# Patient Record
Sex: Male | Born: 1985 | Race: Black or African American | Hispanic: No | Marital: Single | Smoking: Never smoker
Health system: Southern US, Community
[De-identification: ages and names within clinical notes are randomized; demographics above are authoritative.]

## PROBLEM LIST (undated history)

## (undated) DIAGNOSIS — R569 Unspecified convulsions: Secondary | ICD-10-CM

## (undated) DIAGNOSIS — S3992XA Unspecified injury of lower back, initial encounter: Secondary | ICD-10-CM

---
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
## 2016-07-23 ENCOUNTER — Encounter (HOSPITAL_COMMUNITY): Payer: Self-pay | Admitting: Emergency Medicine

## 2016-07-23 ENCOUNTER — Emergency Department (HOSPITAL_COMMUNITY)
Admission: EM | Admit: 2016-07-23 | Discharge: 2016-07-23 | Disposition: A | Discharge status: Home / Self Care | Payer: Self-pay | Attending: Emergency Medicine | Admitting: Emergency Medicine

## 2016-07-23 ENCOUNTER — Emergency Department (HOSPITAL_COMMUNITY): Payer: Self-pay

## 2016-07-23 DIAGNOSIS — S0990XA Unspecified injury of head, initial encounter: Secondary | ICD-10-CM | POA: Insufficient documentation

## 2016-07-23 DIAGNOSIS — W228XXA Striking against or struck by other objects, initial encounter: Secondary | ICD-10-CM | POA: Insufficient documentation

## 2016-07-23 DIAGNOSIS — W19XXXA Unspecified fall, initial encounter: Secondary | ICD-10-CM

## 2016-07-23 DIAGNOSIS — Y939 Activity, unspecified: Secondary | ICD-10-CM | POA: Insufficient documentation

## 2016-07-23 DIAGNOSIS — Y929 Unspecified place or not applicable: Secondary | ICD-10-CM | POA: Insufficient documentation

## 2016-07-23 DIAGNOSIS — Y999 Unspecified external cause status: Secondary | ICD-10-CM | POA: Insufficient documentation

## 2016-07-23 LAB — CBG MONITORING, ED: Glucose-Capillary: 92 mg/dL (ref 65–99)

## 2016-07-23 MED ORDER — ACETAMINOPHEN 325 MG PO TABS
650.0000 mg | ORAL_TABLET | Freq: Once | ORAL | Status: AC
  Administered 2016-07-23: 650 mg via ORAL
  Filled 2016-07-23: qty 2

## 2016-07-23 MED ORDER — SODIUM CHLORIDE 0.9 % IV BOLUS (SEPSIS)
1000.0000 mL | Freq: Once | INTRAVENOUS | Status: AC
  Administered 2016-07-23: 1000 mL via INTRAVENOUS
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
## 2016-07-23 NOTE — ED Notes (Signed)
Pt returned from CT °
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
## 2016-07-23 NOTE — Discharge Instructions (Signed)
You have been seen today for evaluation following a fall and possible seizure. Your imaging showed no signs of bleeding or skull fracture. It is recommended that you follow-up with a neurologist as soon as possible. This will allow for further testing. It is recommended that you avoid activities that can be dangerous should you have a seizure. These include, but are not limited to, driving, operating heavy machinery, and drinking alcohol or using drugs. Having a seizure while performing these activities can make you a danger to yourself or others. Return to the ED should symptoms recur.  Due to your fall and hitting your head, you have likely sustained a concussion. Refer to the attached literature for details on symptoms that would require you to return to the ED. Take ibuprofen or naproxen for pain. Apply ice to areas of swelling to reduce inflammation.
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
## 2016-09-03 ENCOUNTER — Emergency Department (HOSPITAL_COMMUNITY): Payer: Self-pay

## 2016-09-03 ENCOUNTER — Encounter (HOSPITAL_COMMUNITY): Payer: Self-pay | Admitting: Emergency Medicine

## 2016-09-03 ENCOUNTER — Emergency Department (HOSPITAL_COMMUNITY)
Admission: EM | Admit: 2016-09-03 | Discharge: 2016-09-03 | Disposition: A | Discharge status: Home / Self Care | Payer: Self-pay | Attending: Emergency Medicine | Admitting: Emergency Medicine

## 2016-09-03 DIAGNOSIS — S8010XA Contusion of unspecified lower leg, initial encounter: Secondary | ICD-10-CM

## 2016-09-03 DIAGNOSIS — Y999 Unspecified external cause status: Secondary | ICD-10-CM | POA: Insufficient documentation

## 2016-09-03 DIAGNOSIS — S8002XA Contusion of left knee, initial encounter: Secondary | ICD-10-CM | POA: Insufficient documentation

## 2016-09-03 DIAGNOSIS — R51 Headache: Secondary | ICD-10-CM | POA: Insufficient documentation

## 2016-09-03 DIAGNOSIS — R569 Unspecified convulsions: Secondary | ICD-10-CM | POA: Insufficient documentation

## 2016-09-03 DIAGNOSIS — Y92410 Unspecified street and highway as the place of occurrence of the external cause: Secondary | ICD-10-CM | POA: Insufficient documentation

## 2016-09-03 DIAGNOSIS — S39012A Strain of muscle, fascia and tendon of lower back, initial encounter: Secondary | ICD-10-CM | POA: Insufficient documentation

## 2016-09-03 DIAGNOSIS — S8000XA Contusion of unspecified knee, initial encounter: Secondary | ICD-10-CM

## 2016-09-03 DIAGNOSIS — F17200 Nicotine dependence, unspecified, uncomplicated: Secondary | ICD-10-CM | POA: Insufficient documentation

## 2016-09-03 DIAGNOSIS — Y939 Activity, unspecified: Secondary | ICD-10-CM | POA: Insufficient documentation

## 2016-09-03 HISTORY — DX: Unspecified convulsions: R56.9

## 2016-09-03 LAB — CBC WITH DIFFERENTIAL/PLATELET
Basophils Absolute: 0 10*3/uL (ref 0.0–0.1)
Basophils Relative: 0 %
Eosinophils Absolute: 0.2 10*3/uL (ref 0.0–0.7)
Eosinophils Relative: 1 %
HCT: 38.5 % — ABNORMAL LOW (ref 39.0–52.0)
HEMOGLOBIN: 13.5 g/dL (ref 13.0–17.0)
LYMPHS ABS: 1.5 10*3/uL (ref 0.7–4.0)
LYMPHS PCT: 11 %
MCH: 31.6 pg (ref 26.0–34.0)
MCHC: 35.1 g/dL (ref 30.0–36.0)
MCV: 90.2 fL (ref 78.0–100.0)
Monocytes Absolute: 1.1 10*3/uL — ABNORMAL HIGH (ref 0.1–1.0)
Monocytes Relative: 8 %
NEUTROS PCT: 80 %
Neutro Abs: 11.2 10*3/uL — ABNORMAL HIGH (ref 1.7–7.7)
Platelets: 309 10*3/uL (ref 150–400)
RBC: 4.27 MIL/uL (ref 4.22–5.81)
RDW: 12.8 % (ref 11.5–15.5)
WBC: 14 10*3/uL — AB (ref 4.0–10.5)

## 2016-09-03 LAB — BASIC METABOLIC PANEL
ANION GAP: 9 (ref 5–15)
BUN: 8 mg/dL (ref 6–20)
CHLORIDE: 103 mmol/L (ref 101–111)
CO2: 24 mmol/L (ref 22–32)
Calcium: 9.3 mg/dL (ref 8.9–10.3)
Creatinine, Ser: 1.17 mg/dL (ref 0.61–1.24)
GFR calc Af Amer: >60 mL/min (ref >60)
GFR calc non Af Amer: >60 mL/min (ref >60)
GLUCOSE: 108 mg/dL — AB (ref 65–99)
POTASSIUM: 3.8 mmol/L (ref 3.5–5.1)
SODIUM: 136 mmol/L (ref 135–145)

## 2016-09-03 LAB — MAGNESIUM: Magnesium: 2.8 mg/dL — ABNORMAL HIGH (ref 1.7–2.4)

## 2016-09-03 LAB — PHOSPHORUS: Phosphorus: 1.6 mg/dL — ABNORMAL LOW (ref 2.5–4.6)

## 2016-09-03 MED ORDER — LEVETIRACETAM 500 MG PO TABS: 500.0000 mg | ORAL_TABLET | Freq: Two times a day (BID) | ORAL | 0 refills | Status: DC

## 2016-09-03 MED ORDER — LEVETIRACETAM 500 MG PO TABS
1000.0000 mg | ORAL_TABLET | Freq: Once | ORAL | Status: AC
  Administered 2016-09-03: 1000 mg via ORAL
  Filled 2016-09-03: qty 2

## 2016-09-03 MED ORDER — IBUPROFEN 600 MG PO TABS: 600.0000 mg | ORAL_TABLET | Freq: Four times a day (QID) | ORAL | 0 refills | Status: DC | PRN

## 2016-09-03 NOTE — ED Notes (Signed)
Nurse drawing labs. 
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
## 2016-09-03 NOTE — ED Notes (Signed)
Pt in radiology at this time via stretcher
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
## 2016-09-03 NOTE — Progress Notes (Signed)
Orthopedic Tech Progress Note Patient Details:  Brandon RainwaterDeaunte Riddle 1985/12/02 [id number]  Ortho Devices Type of Ortho Device: Knee Immobilizer Ortho Device/Splint Location: lle Ortho Device/Splint Interventions: Ordered, Application   Trinna PostMartinez, Sherry Blackard J 09/03/2016, 7:01 AM

## 2016-09-03 NOTE — ED Notes (Signed)
Pt and family understood dc material. NAD noted. Scripts given at dc 

## 2017-01-09 ENCOUNTER — Encounter (HOSPITAL_COMMUNITY): Payer: Self-pay | Admitting: *Deleted

## 2017-01-09 ENCOUNTER — Emergency Department (HOSPITAL_COMMUNITY)
Admission: EM | Admit: 2017-01-09 | Discharge: 2017-01-10 | Disposition: A | Discharge status: Home / Self Care | Payer: Self-pay | Attending: Emergency Medicine | Admitting: Emergency Medicine

## 2017-01-09 DIAGNOSIS — F17200 Nicotine dependence, unspecified, uncomplicated: Secondary | ICD-10-CM | POA: Insufficient documentation

## 2017-01-09 DIAGNOSIS — Y9302 Activity, running: Secondary | ICD-10-CM | POA: Insufficient documentation

## 2017-01-09 DIAGNOSIS — Y999 Unspecified external cause status: Secondary | ICD-10-CM | POA: Insufficient documentation

## 2017-01-09 DIAGNOSIS — S71111A Laceration without foreign body, right thigh, initial encounter: Secondary | ICD-10-CM | POA: Insufficient documentation

## 2017-01-09 DIAGNOSIS — W268XXA Contact with other sharp object(s), not elsewhere classified, initial encounter: Secondary | ICD-10-CM | POA: Insufficient documentation

## 2017-01-09 DIAGNOSIS — Y929 Unspecified place or not applicable: Secondary | ICD-10-CM | POA: Insufficient documentation

## 2017-01-09 MED ORDER — POVIDONE-IODINE 10 % EX SOLN
CUTANEOUS | Status: AC
  Filled 2017-01-09: qty 118

## 2017-01-09 MED ORDER — LIDOCAINE HCL (PF) 2 % IJ SOLN
INTRAMUSCULAR | Status: DC
Start: 2017-01-09 — End: 2017-01-10
  Filled 2017-01-09: qty 10
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
## 2017-01-10 MED ORDER — IBUPROFEN 800 MG PO TABS: 800.0000 mg | ORAL_TABLET | Freq: Three times a day (TID) | ORAL | 0 refills | Status: DC

## 2017-01-10 NOTE — ED Notes (Signed)
Cleaned bilateral hands with sur clens and bandaids applied to fingers.
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
## 2017-01-10 NOTE — Discharge Instructions (Signed)
Clean with mild soap and water and keep it bandaged.  Sutures out in 10 days.  Return here for any signs of infection

## 2017-01-22 ENCOUNTER — Encounter (HOSPITAL_COMMUNITY): Payer: Self-pay | Admitting: Emergency Medicine

## 2017-01-22 ENCOUNTER — Emergency Department (HOSPITAL_COMMUNITY)
Admission: EM | Admit: 2017-01-22 | Discharge: 2017-01-22 | Disposition: A | Discharge status: Home / Self Care | Payer: Self-pay | Attending: Emergency Medicine | Admitting: Emergency Medicine

## 2017-01-22 DIAGNOSIS — Z4802 Encounter for removal of sutures: Secondary | ICD-10-CM | POA: Insufficient documentation

## 2017-01-22 DIAGNOSIS — F17200 Nicotine dependence, unspecified, uncomplicated: Secondary | ICD-10-CM | POA: Insufficient documentation

## 2017-01-22 DIAGNOSIS — Z23 Encounter for immunization: Secondary | ICD-10-CM | POA: Insufficient documentation

## 2017-01-22 MED ORDER — IBUPROFEN 400 MG PO TABS
600.0000 mg | ORAL_TABLET | Freq: Once | ORAL | Status: AC
  Administered 2017-01-22: 600 mg via ORAL
  Filled 2017-01-22: qty 1

## 2017-01-22 MED ORDER — TETANUS-DIPHTH-ACELL PERTUSSIS 5-2.5-18.5 LF-MCG/0.5 IM SUSP
0.5000 mL | Freq: Once | INTRAMUSCULAR | Status: AC
  Administered 2017-01-22: 0.5 mL via INTRAMUSCULAR
  Filled 2017-01-22: qty 0.5

## 2017-01-22 NOTE — ED Triage Notes (Signed)
Pt here for suture removal to right thigh, he says about 17-20 stitches. They have been in place for 12 days.

## 2017-01-22 NOTE — Discharge Instructions (Signed)
Please clean the area with soap and water. Please return to the Emergency Department for new or worsening symptoms. You may call the number on your discharge paperwork to get established with a primary care provider.
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
## 2017-07-07 ENCOUNTER — Encounter (HOSPITAL_COMMUNITY): Payer: Self-pay | Admitting: *Deleted

## 2017-07-07 ENCOUNTER — Emergency Department (HOSPITAL_COMMUNITY): Payer: Self-pay

## 2017-07-07 ENCOUNTER — Emergency Department (HOSPITAL_COMMUNITY)
Admission: EM | Admit: 2017-07-07 | Discharge: 2017-07-07 | Disposition: A | Discharge status: Home / Self Care | Payer: Self-pay | Attending: Emergency Medicine | Admitting: Emergency Medicine

## 2017-07-07 DIAGNOSIS — R569 Unspecified convulsions: Secondary | ICD-10-CM | POA: Insufficient documentation

## 2017-07-07 DIAGNOSIS — F17200 Nicotine dependence, unspecified, uncomplicated: Secondary | ICD-10-CM | POA: Insufficient documentation

## 2017-07-07 LAB — URINALYSIS, ROUTINE W REFLEX MICROSCOPIC
BILIRUBIN URINE: NEGATIVE
Glucose, UA: NEGATIVE mg/dL
KETONES UR: 5 mg/dL — AB
Leukocytes, UA: NEGATIVE
Nitrite: NEGATIVE
PROTEIN: 100 mg/dL — AB
Specific Gravity, Urine: 1.013 (ref 1.005–1.030)
pH: 5 (ref 5.0–8.0)

## 2017-07-07 LAB — BASIC METABOLIC PANEL
Anion gap: 17 — ABNORMAL HIGH (ref 5–15)
BUN: 12 mg/dL (ref 6–20)
CHLORIDE: 105 mmol/L (ref 101–111)
CO2: 16 mmol/L — ABNORMAL LOW (ref 22–32)
CREATININE: 1.3 mg/dL — AB (ref 0.61–1.24)
Calcium: 9.3 mg/dL (ref 8.9–10.3)
Glucose, Bld: 103 mg/dL — ABNORMAL HIGH (ref 65–99)
Potassium: 4 mmol/L (ref 3.5–5.1)
SODIUM: 138 mmol/L (ref 135–145)

## 2017-07-07 LAB — CBG MONITORING, ED
GLUCOSE-CAPILLARY: 84 mg/dL (ref 65–99)
Glucose-Capillary: 96 mg/dL (ref 65–99)

## 2017-07-07 LAB — CBC
HCT: 42.1 % (ref 39.0–52.0)
HEMOGLOBIN: 14.1 g/dL (ref 13.0–17.0)
MCH: 30.6 pg (ref 26.0–34.0)
MCHC: 33.5 g/dL (ref 30.0–36.0)
MCV: 91.3 fL (ref 78.0–100.0)
Platelets: 357 10*3/uL (ref 150–400)
RBC: 4.61 MIL/uL (ref 4.22–5.81)
RDW: 14.4 % (ref 11.5–15.5)
WBC: 10.2 10*3/uL (ref 4.0–10.5)

## 2017-07-07 MED ORDER — OXYCODONE-ACETAMINOPHEN 5-325 MG PO TABS
1.0000 | ORAL_TABLET | Freq: Once | ORAL | Status: AC
  Administered 2017-07-07: 1 via ORAL
  Filled 2017-07-07: qty 1

## 2017-07-07 MED ORDER — LORAZEPAM 1 MG PO TABS
1.0000 mg | ORAL_TABLET | Freq: Once | ORAL | Status: AC
  Administered 2017-07-07: 1 mg via ORAL
  Filled 2017-07-07: qty 1

## 2017-07-07 NOTE — ED Provider Notes (Signed)
[facility]PT Provider Note   CSN: [id number] Arrival date & time: 07/07/17  1421     History   Chief Complaint Chief Complaint  Patient presents with  . Seizures    HPI Brandon Riddle is a 31 y.o. male.  HPI Patient is a 31 year old male with a history of seizure in 2017 he presents the emergency department today after possible seizure today.  The brother states he hurt it fell when in the room and found the patient lying on the floor.  He began having nausea and vomiting.  He reports mild to moderate headache at this time.  No tongue biting.  No urinary incontinence.  No new medications.  Patient had a postictal period for his mental status improved.  Patient was in his normal state health earlier today.  No fevers or chills.  Denies neck pain or neck stiffness.  Does report some discomfort in his right shoulder since the possible seizure.  No change in his vision.  Denies weakness of his arms or legs.  No back pain.  No abdominal pain.   Past Medical History:  Diagnosis Date  . Seizure (HCC)     There are no active problems to display for this patient.   History reviewed. No pertinent surgical history.     Home Medications    Prior to Admission medications   Medication Sig Start Date End Date Taking? Authorizing Provider  ibuprofen (ADVIL,MOTRIN) 800 MG tablet Take 1 tablet (800 mg total) by mouth 3 (three) times daily. 01/10/17   Triplett, Babette Relic, PA-C    Family History No family history on file.  Social History Social History  Substance Use Topics  . Smoking status: Current Some Day Smoker  . Smokeless tobacco: Never Used  . Alcohol use Yes     Comment: occ     Allergies   Patient has no known allergies.   Review of Systems Review of Systems  All other systems reviewed and are negative.    Physical Exam Updated Vital Signs BP 122/88 (BP Location: Right Arm)   Pulse 89   Temp (!) 97.5 F (36.4 C) (Oral)   Resp 16   SpO2 95%   Physical  Exam  Constitutional: He is oriented to person, place, and time. He appears well-developed and well-nourished.  HENT:  Head: Normocephalic and atraumatic.  No tongue biting noted  Eyes: EOM are normal.  Neck: Normal range of motion.  Cardiovascular: Normal rate and regular rhythm.   Pulmonary/Chest: Effort normal and breath sounds normal. No respiratory distress.  Abdominal: Soft. He exhibits no distension. There is no tenderness.  Neurological: He is alert and oriented to person, place, and time.  Skin: Skin is warm and dry.  Psychiatric: He has a normal mood and affect.  Nursing note and vitals reviewed.    ED Treatments / Results  Labs (all labs ordered are listed, but only abnormal results are displayed) Labs Reviewed  BASIC METABOLIC PANEL - Abnormal; Notable for the following:       Result Value   CO2 16 (*)    Glucose, Bld 103 (*)    Creatinine, Ser 1.30 (*)    Anion gap 17 (*)    All other components within normal limits  CBC  URINALYSIS, ROUTINE W REFLEX MICROSCOPIC  CBG MONITORING, ED  CBG MONITORING, ED    EKG  EKG Interpretation  Date/Time:  Friday July 07 2017 14:35:38 EDT Ventricular Rate:  93 PR Interval:  162 QRS Duration: 108 QT  Interval:  384 QTC Calculation: 477 R Axis:   95 Text Interpretation:  Normal sinus rhythm Rightward axis Incomplete right bundle branch block Borderline ECG No significant change was found Confirmed by Azalia Bilis ([id number]) on 07/07/2017 4:38:57 PM       Radiology Dg Shoulder Right  Result Date: 07/07/2017 CLINICAL DATA:  Fall, shoulder pain EXAM: RIGHT SHOULDER - 2+ VIEW COMPARISON:  None available FINDINGS: There is no evidence of fracture or dislocation. There is no evidence of arthropathy or other focal bone abnormality. Soft tissues are unremarkable. IMPRESSION: Negative. Electronically Signed   By: Judie Petit.  Shick M.D.   On: 07/07/2017 16:13   Ct Head Wo Contrast  Result Date: 07/07/2017 CLINICAL DATA:  Acute  headache, possible seizure activity EXAM: CT HEAD WITHOUT CONTRAST TECHNIQUE: Contiguous axial images were obtained from the base of the skull through the vertex without intravenous contrast. COMPARISON:  09/03/2016 FINDINGS: Brain: No evidence of acute infarction, hemorrhage, hydrocephalus, extra-axial collection or mass lesion/mass effect. Vascular: No hyperdense vessel or unexpected calcification. Skull: Normal. Negative for fracture or focal lesion. Sinuses/Orbits: Minor scattered sinus mucosal thickening. Orbits unremarkable. Other: None. IMPRESSION: No acute intracranial abnormality.  Minor chronic sinus disease. Electronically Signed   By: Judie Petit.  Shick M.D.   On: 07/07/2017 16:29    Procedures Procedures (including critical care time)  Medications Ordered in ED Medications  oxyCODONE-acetaminophen (PERCOCET/ROXICET) 5-325 MG per tablet 1 tablet (1 tablet Oral Given 07/07/17 1549)  LORazepam (ATIVAN) tablet 1 mg (1 mg Oral Given 07/07/17 1549)     Initial Impression / Assessment and Plan / ED Course  I have reviewed the triage vital signs and the nursing notes.  Pertinent labs & imaging results that were available during my care of the patient were reviewed by me and considered in my medical decision making (see chart for details).     Patient is overall well-appearing.  Likely seizure today.  Patient be referred to neurology as an outpatient.  Head CT without abnormality here.  Standard seizure precautions given.  He understands importance of close neurology follow-up.  Final Clinical Impressions(s) / ED Diagnoses   Final diagnoses:  Seizure [facility])    New Prescriptions New Prescriptions   No medications on file     Azalia Bilis, MD 07/07/17 [phone]

## 2017-07-07 NOTE — ED Triage Notes (Signed)
Pt appears postictal at triage. Thinks he may have had a seizure and states he has had one before.  No medications. No incontinence noted

## 2017-07-07 NOTE — ED Notes (Signed)
Patient transported to X-ray 

## 2017-07-07 NOTE — ED Notes (Signed)
PT actively vomiting at triage.  Brother is now here and states he heard a fall and then went in and patient was still on the floor.

## 2017-07-15 ENCOUNTER — Encounter (HOSPITAL_COMMUNITY): Payer: Self-pay | Admitting: Emergency Medicine

## 2017-07-15 ENCOUNTER — Emergency Department (HOSPITAL_COMMUNITY)
Admission: EM | Admit: 2017-07-15 | Discharge: 2017-07-15 | Disposition: A | Discharge status: Home / Self Care | Payer: Self-pay | Attending: Emergency Medicine | Admitting: Emergency Medicine

## 2017-07-15 DIAGNOSIS — Z79899 Other long term (current) drug therapy: Secondary | ICD-10-CM | POA: Insufficient documentation

## 2017-07-15 DIAGNOSIS — F17200 Nicotine dependence, unspecified, uncomplicated: Secondary | ICD-10-CM | POA: Insufficient documentation

## 2017-07-15 DIAGNOSIS — H7292 Unspecified perforation of tympanic membrane, left ear: Secondary | ICD-10-CM | POA: Insufficient documentation

## 2017-07-15 MED ORDER — AMOXICILLIN-POT CLAVULANATE 875-125 MG PO TABS: 1.0000 | ORAL_TABLET | Freq: Two times a day (BID) | ORAL | 0 refills | Status: DC

## 2017-07-15 MED ORDER — HYDROCODONE-ACETAMINOPHEN 5-325 MG PO TABS: 1.0000 | ORAL_TABLET | Freq: Four times a day (QID) | ORAL | 0 refills | Status: DC | PRN

## 2017-07-15 MED ORDER — IBUPROFEN 800 MG PO TABS: 800.0000 mg | ORAL_TABLET | Freq: Three times a day (TID) | ORAL | 0 refills | Status: DC | PRN

## 2017-07-15 NOTE — ED Provider Notes (Signed)
[facility]-EM[facility] DEPT Provider Note   CSN: [id number] Arrival date & time: 07/15/17  1759     History   Chief Complaint Chief Complaint  Patient presents with  . Otalgia    HPI Brandon Riddle is a 31 y.o. male.  HPI Patient presents to the emergency department with pain in left ear.  The patient states he was cleaning his left ear with a Q-tip when he felt like he pushed it back too far started with immediate pain and decreased hearing.  Patient states that he did not take any other medications prior to arrival.  Patient denies headache, blurred vision, or fever Past Medical History:  Diagnosis Date  . Seizure (HCC)     There are no active problems to display for this patient.   History reviewed. No pertinent surgical history.     Home Medications    Prior to Admission medications   Medication Sig Start Date End Date Taking? Authorizing Provider  ibuprofen (ADVIL,MOTRIN) 800 MG tablet Take 1 tablet (800 mg total) by mouth 3 (three) times daily. 01/10/17   Triplett, Babette Relic, PA-C    Family History No family history on file.  Social History Social History  Substance Use Topics  . Smoking status: Current Some Day Smoker  . Smokeless tobacco: Never Used  . Alcohol use Yes     Comment: occ     Allergies   Patient has no known allergies.   Review of Systems Review of Systems  All other systems negative except as documented in the HPI. All pertinent positives and negatives as reviewed in the HPI. Physical Exam Updated Vital Signs BP (!) 150/91 (BP Location: Right Arm)   Pulse (!) 54   Temp 98.3 F (36.8 C) (Oral)   Resp 17   Ht  (1.803 m)   Wt 86.2 kg (190 lb)   SpO2 99%   BMI 26.50 kg/m   Physical Exam  Constitutional: He is oriented to person, place, and time. He appears well-developed and well-nourished. No distress.  HENT:  Head: Normocephalic and atraumatic.  Right Ear: Tympanic membrane is not perforated.  Left Ear: Tympanic membrane is  perforated.  Eyes: Pupils are equal, round, and reactive to light.  Pulmonary/Chest: Effort normal.  Neurological: He is alert and oriented to person, place, and time.  Skin: Skin is warm and dry.  Psychiatric: He has a normal mood and affect.  Nursing note and vitals reviewed.    ED Treatments / Results  Labs (all labs ordered are listed, but only abnormal results are displayed) Labs Reviewed - No data to display  EKG  EKG Interpretation None       Radiology No results found.  Procedures Procedures (including critical care time)  Medications Ordered in ED Medications - No data to display   Initial Impression / Assessment and Plan / ED Course  I have reviewed the triage vital signs and the nursing notes.  Pertinent labs & imaging results that were available during my care of the patient were reviewed by me and considered in my medical decision making (see chart for details).     He should not be treated with pain medication and antibiotics will be referred to ENT.  Told to return here as needed.  Patient agrees the plan and all questions were answered  Final Clinical Impressions(s) / ED Diagnoses   Final diagnoses:  None    New Prescriptions New Prescriptions   No medications on file     Charlestine Night,  PA-C 07/15/17 2003    Nira Conn, MD 07/16/17 [phone]

## 2017-07-15 NOTE — Discharge Instructions (Signed)
You will need to follow-up with the ENT specialist provided to ensure that this is healing correctly.  Return here as needed.  I would also use cotton to cover the ear canal is there may be some drainage and this will help minimize discomfort

## 2017-07-15 NOTE — ED Triage Notes (Signed)
Pt. Stated, I was cleaning my left ear with a Q tip and took it out and started pushing water in there and it hurts unreal.

## 2017-07-19 ENCOUNTER — Encounter: Payer: Self-pay | Admitting: Neurology

## 2017-07-21 ENCOUNTER — Encounter: Payer: Self-pay | Admitting: Neurology

## 2017-07-21 ENCOUNTER — Ambulatory Visit (INDEPENDENT_AMBULATORY_CARE_PROVIDER_SITE_OTHER): Payer: Self-pay | Admitting: Neurology

## 2017-07-21 VITALS — BP 112/80 | HR 48 | Ht 71.0 in | Wt 190.0 lb

## 2017-07-21 DIAGNOSIS — R569 Unspecified convulsions: Secondary | ICD-10-CM

## 2017-07-21 NOTE — Patient Instructions (Signed)
1. Schedule 1-hour sleep-deprived EEG 2. You will be called with the test results and plans afterward 3. As per [place] driving laws, no driving until 6 months seizure-free 4. Follow-up in 3-4 months, call for any changes  Seizure Precautions: 1. If medication has been prescribed for you to prevent seizures, take it exactly as directed.  Do not stop taking the medicine without talking to your doctor first, even if you have not had a seizure in a long time.   2. Avoid activities in which a seizure would cause danger to yourself or to others.  Don't operate dangerous machinery, swim alone, or climb in high or dangerous places, such as on ladders, roofs, or girders.  Do not drive unless your doctor says you may.  3. If you have any warning that you may have a seizure, lay down in a safe place where you can't hurt yourself.    4.  No driving for 6 months from last seizure, as per [facility].   Please refer to the following link on the [employer]ile[employer] of [employer]'s website for more information: http://www.epilepsyfoundation.org/answerplace/Social/driving/drivingu.cfm   5.  Maintain good sleep hygiene. Avoid alcohol.  6.  Contact your doctor if you have any problems that may be related to the medicine you are taking.  7.  Call 911 and bring the patient back to the ED if:        A.  The seizure lasts longer than 5 minutes.       B.  The patient doesn't awaken shortly after the seizure  C.  The patient has new problems such as difficulty seeing, speaking or moving  D.  The patient was injured during the seizure  E.  The patient has a temperature over 102 F (39C)  F.  The patient vomited and now is having trouble breathing
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
## 2017-07-26 ENCOUNTER — Encounter: Payer: Self-pay | Admitting: Neurology

## 2017-07-26 DIAGNOSIS — R569 Unspecified convulsions: Secondary | ICD-10-CM | POA: Insufficient documentation

## 2017-07-27 ENCOUNTER — Other Ambulatory Visit: Payer: Self-pay

## 2017-11-15 ENCOUNTER — Encounter (HOSPITAL_COMMUNITY): Payer: Self-pay

## 2017-11-15 ENCOUNTER — Emergency Department (HOSPITAL_COMMUNITY)
Admission: EM | Admit: 2017-11-15 | Discharge: 2017-11-15 | Disposition: A | Discharge status: Home / Self Care | Payer: Self-pay | Attending: Emergency Medicine | Admitting: Emergency Medicine

## 2017-11-15 ENCOUNTER — Other Ambulatory Visit: Payer: Self-pay

## 2017-11-15 DIAGNOSIS — R569 Unspecified convulsions: Secondary | ICD-10-CM | POA: Insufficient documentation

## 2017-11-15 DIAGNOSIS — Z5321 Procedure and treatment not carried out due to patient leaving prior to being seen by health care provider: Secondary | ICD-10-CM | POA: Insufficient documentation

## 2017-11-15 NOTE — ED Triage Notes (Signed)
Pt here today with seizure like activity , pt has been seen for same before , pt did have have two wisdom teeth pulled today

## 2017-11-15 NOTE — ED Notes (Signed)
This RN went in pt room to begin triage and asked pt what brings him to the ER. PT stated "man I had a seizure today what else do you want me to tell you. That's all. you're the 5th person ive told. I font know what you want me to tell you. " I explained to the pt this was the first time I was meeting him and I have no other way to know why he need to be seen other than to ask. Pt continued to have attitude while I explained this to him and was asked not to speak to staff this way. Pt then stated "I have a $80,000 car and $200,000 house. I don't need you in here talking to me like this. I know you're broke". This RN exited the room and asked [place] RN to complete triage

## 2017-11-15 NOTE — ED Notes (Signed)
Pt stated that he was leaving.  

## 2017-12-18 ENCOUNTER — Ambulatory Visit: Payer: Self-pay | Admitting: Neurology

## 2018-04-14 ENCOUNTER — Emergency Department (HOSPITAL_COMMUNITY)
Admission: EM | Admit: 2018-04-14 | Discharge: 2018-04-14 | Disposition: A | Discharge status: Home / Self Care | Payer: Self-pay | Attending: Emergency Medicine | Admitting: Emergency Medicine

## 2018-04-14 ENCOUNTER — Encounter (HOSPITAL_COMMUNITY): Payer: Self-pay | Admitting: Emergency Medicine

## 2018-04-14 ENCOUNTER — Emergency Department (HOSPITAL_COMMUNITY): Payer: Self-pay

## 2018-04-14 DIAGNOSIS — S22009D Unspecified fracture of unspecified thoracic vertebra, subsequent encounter for fracture with routine healing: Secondary | ICD-10-CM | POA: Insufficient documentation

## 2018-04-14 DIAGNOSIS — Z79899 Other long term (current) drug therapy: Secondary | ICD-10-CM | POA: Insufficient documentation

## 2018-04-14 DIAGNOSIS — Z9181 History of falling: Secondary | ICD-10-CM | POA: Insufficient documentation

## 2018-04-14 DIAGNOSIS — S129XXD Fracture of neck, unspecified, subsequent encounter: Secondary | ICD-10-CM | POA: Insufficient documentation

## 2018-04-14 DIAGNOSIS — W19XXXA Unspecified fall, initial encounter: Secondary | ICD-10-CM

## 2018-04-14 DIAGNOSIS — X58XXXD Exposure to other specified factors, subsequent encounter: Secondary | ICD-10-CM | POA: Insufficient documentation

## 2018-04-14 DIAGNOSIS — F419 Anxiety disorder, unspecified: Secondary | ICD-10-CM

## 2018-04-14 HISTORY — DX: Unspecified convulsions: R56.9

## 2018-04-14 HISTORY — DX: Unspecified injury of lower back, initial encounter: S39.92XA

## 2018-04-14 LAB — CBC WITH DIFFERENTIAL/PLATELET
BASOS ABS: 0 10*3/uL (ref 0.0–0.1)
Basophils Relative: 0 %
Eosinophils Absolute: 0.1 10*3/uL (ref 0.0–0.7)
Eosinophils Relative: 1 %
HEMATOCRIT: 42 % (ref 39.0–52.0)
Hemoglobin: 14.7 g/dL (ref 13.0–17.0)
LYMPHS ABS: 1.2 10*3/uL (ref 0.7–4.0)
LYMPHS PCT: 11 %
MCH: 32 pg (ref 26.0–34.0)
MCHC: 35 g/dL (ref 30.0–36.0)
MCV: 91.5 fL (ref 78.0–100.0)
Monocytes Absolute: 0.5 10*3/uL (ref 0.1–1.0)
Monocytes Relative: 5 %
NEUTROS ABS: 8.8 10*3/uL — AB (ref 1.7–7.7)
NEUTROS PCT: 83 %
Platelets: 311 10*3/uL (ref 150–400)
RBC: 4.59 MIL/uL (ref 4.22–5.81)
RDW: 13.2 % (ref 11.5–15.5)
WBC: 10.6 10*3/uL — AB (ref 4.0–10.5)

## 2018-04-14 LAB — BASIC METABOLIC PANEL
Anion gap: 9 (ref 5–15)
BUN: 11 mg/dL (ref 6–20)
CHLORIDE: 106 mmol/L (ref 98–111)
CO2: 24 mmol/L (ref 22–32)
Calcium: 9.4 mg/dL (ref 8.9–10.3)
Creatinine, Ser: 1.14 mg/dL (ref 0.61–1.24)
GFR calc non Af Amer: >60 mL/min (ref >60)
Glucose, Bld: 113 mg/dL — ABNORMAL HIGH (ref 70–99)
POTASSIUM: 3.4 mmol/L — AB (ref 3.5–5.1)
SODIUM: 139 mmol/L (ref 135–145)

## 2018-04-14 MED ORDER — SODIUM CHLORIDE 0.9 % IV BOLUS
500.0000 mL | Freq: Once | INTRAVENOUS | Status: AC
  Administered 2018-04-14: 500 mL via INTRAVENOUS

## 2018-04-14 MED ORDER — LORAZEPAM 1 MG PO TABS: 1.0000 mg | ORAL_TABLET | Freq: Four times a day (QID) | ORAL | 0 refills | Status: AC | PRN

## 2018-04-14 MED ORDER — LORAZEPAM 2 MG/ML IJ SOLN
1.0000 mg | Freq: Once | INTRAMUSCULAR | Status: AC
  Administered 2018-04-14: 1 mg via INTRAVENOUS
  Filled 2018-04-14: qty 1

## 2018-04-14 MED ORDER — KETOROLAC TROMETHAMINE 30 MG/ML IJ SOLN
30.0000 mg | Freq: Once | INTRAMUSCULAR | Status: AC
  Administered 2018-04-14: 30 mg via INTRAVENOUS
  Filled 2018-04-14: qty 1

## 2018-04-14 NOTE — ED Notes (Signed)
Patient pleasant now.
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
## 2018-04-14 NOTE — Discharge Instructions (Addendum)
There were no serious problems found on the testing today.  We are giving you a short-term prescription for anxiety or nerves, to help avoid problems.  It is important to follow-up with a neurologist for further evaluation of a possible seizure problem.  Follow-[facility] with your spine doctor at [facility] for ongoing management of your spine fractures.
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
## 2018-04-14 NOTE — ED Notes (Signed)
Pt's girlfriend is at bedside and states that when she arrived home that the glass dining room table has 2 layers of glass and that the bottom glass piece was broken. She is assuming from Pt having a seizure. Pt has a cut on medial side of left knee.

## 2018-04-14 NOTE — ED Notes (Signed)
Pt verbally abusive

## 2018-04-24 LAB — DRUG SCREEN 10 W/CONF, SERUM

## 2018-07-04 ENCOUNTER — Other Ambulatory Visit: Payer: Self-pay

## 2018-07-04 ENCOUNTER — Encounter (HOSPITAL_COMMUNITY): Payer: Self-pay

## 2018-07-04 ENCOUNTER — Emergency Department (HOSPITAL_COMMUNITY)
Admission: EM | Admit: 2018-07-04 | Discharge: 2018-07-05 | Disposition: A | Discharge status: Home / Self Care | Payer: Self-pay | Attending: Emergency Medicine | Admitting: Emergency Medicine

## 2018-07-04 DIAGNOSIS — R569 Unspecified convulsions: Secondary | ICD-10-CM | POA: Insufficient documentation

## 2018-07-04 LAB — CBG MONITORING, ED: Glucose-Capillary: 109 mg/dL — ABNORMAL HIGH (ref 70–99)

## 2018-07-04 NOTE — ED Notes (Signed)
Pt ambulated to bathroom without assistance 

## 2018-07-04 NOTE — ED Notes (Signed)
Bed: [id number] Expected date:  Expected time:  Means of arrival:  Comments: TR 2- seizure and vomiting.

## 2018-07-04 NOTE — ED Triage Notes (Signed)
Pt reports a seizure around 9p. Hx of same. He vomited onto the floor in triage. He states that he takes "blues" meaning xanax daily to prevent seizures, but is not prescribed them. He states that when he doesn't take them, he has seizures. He is agitated and verbally aggressive in triage.

## 2018-07-05 LAB — CBC WITH DIFFERENTIAL/PLATELET
BASOS ABS: 0.1 10*3/uL (ref 0.0–0.1)
BASOS PCT: 1 %
EOS ABS: 0.1 10*3/uL (ref 0.0–0.7)
EOS PCT: 1 %
HCT: 39.1 % (ref 39.0–52.0)
Hemoglobin: 13.4 g/dL (ref 13.0–17.0)
Lymphocytes Relative: 16 %
Lymphs Abs: 1.6 10*3/uL (ref 0.7–4.0)
MCH: 31.5 pg (ref 26.0–34.0)
MCHC: 34.3 g/dL (ref 30.0–36.0)
MCV: 91.8 fL (ref 78.0–100.0)
Monocytes Absolute: 1.2 10*3/uL — ABNORMAL HIGH (ref 0.1–1.0)
Monocytes Relative: 12 %
Neutro Abs: 7.2 10*3/uL (ref 1.7–7.7)
Neutrophils Relative %: 70 %
PLATELETS: 340 10*3/uL (ref 150–400)
RBC: 4.26 MIL/uL (ref 4.22–5.81)
RDW: 13.4 % (ref 11.5–15.5)
WBC: 10.2 10*3/uL (ref 4.0–10.5)

## 2018-07-05 LAB — BASIC METABOLIC PANEL
ANION GAP: 11 (ref 5–15)
BUN: 13 mg/dL (ref 6–20)
CO2: 26 mmol/L (ref 22–32)
Calcium: 9.3 mg/dL (ref 8.9–10.3)
Chloride: 103 mmol/L (ref 98–111)
Creatinine, Ser: 1.12 mg/dL (ref 0.61–1.24)
GFR calc Af Amer: >60 mL/min (ref >60)
Glucose, Bld: 112 mg/dL — ABNORMAL HIGH (ref 70–99)
POTASSIUM: 3.2 mmol/L — AB (ref 3.5–5.1)
SODIUM: 140 mmol/L (ref 135–145)

## 2018-07-05 MED ORDER — LEVETIRACETAM 500 MG PO TABS
500.0000 mg | ORAL_TABLET | Freq: Once | ORAL | Status: AC
  Administered 2018-07-05: 500 mg via ORAL
  Filled 2018-07-05: qty 1

## 2018-07-05 MED ORDER — LORAZEPAM 1 MG PO TABS: 1.0000 mg | ORAL_TABLET | Freq: Once | ORAL | Status: DC

## 2018-07-05 MED ORDER — LEVETIRACETAM 500 MG PO TABS: 500.0000 mg | ORAL_TABLET | Freq: Two times a day (BID) | ORAL | 0 refills | Status: DC

## 2018-07-05 MED ORDER — POTASSIUM CHLORIDE CRYS ER 20 MEQ PO TBCR
40.0000 meq | EXTENDED_RELEASE_TABLET | Freq: Once | ORAL | Status: AC
  Administered 2018-07-05: 40 meq via ORAL
  Filled 2018-07-05: qty 2

## 2018-07-05 NOTE — Discharge Instructions (Addendum)
It was my pleasure taking care of you today!   Start taking Keppra twice daily. This medication will help prevent seizures.   Please call the neurology clinic listed to schedule.  Return to ER for new or worsening symptoms, any additional concerns.
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
## 2018-11-04 ENCOUNTER — Encounter (HOSPITAL_COMMUNITY): Payer: Self-pay

## 2018-11-04 ENCOUNTER — Emergency Department (HOSPITAL_COMMUNITY)
Admission: EM | Admit: 2018-11-04 | Discharge: 2018-11-04 | Disposition: A | Discharge status: Home / Self Care | Payer: Self-pay | Attending: Emergency Medicine | Admitting: Emergency Medicine

## 2018-11-04 ENCOUNTER — Other Ambulatory Visit: Payer: Self-pay

## 2018-11-04 DIAGNOSIS — B349 Viral infection, unspecified: Secondary | ICD-10-CM | POA: Insufficient documentation

## 2018-11-04 DIAGNOSIS — J029 Acute pharyngitis, unspecified: Secondary | ICD-10-CM

## 2018-11-04 DIAGNOSIS — G8929 Other chronic pain: Secondary | ICD-10-CM | POA: Insufficient documentation

## 2018-11-04 DIAGNOSIS — M542 Cervicalgia: Secondary | ICD-10-CM | POA: Insufficient documentation

## 2018-11-04 DIAGNOSIS — R030 Elevated blood-pressure reading, without diagnosis of hypertension: Secondary | ICD-10-CM | POA: Insufficient documentation

## 2018-11-04 DIAGNOSIS — Z79899 Other long term (current) drug therapy: Secondary | ICD-10-CM | POA: Insufficient documentation

## 2018-11-04 DIAGNOSIS — M546 Pain in thoracic spine: Secondary | ICD-10-CM | POA: Insufficient documentation

## 2018-11-04 LAB — GROUP A STREP BY PCR: Group A Strep by PCR: NOT DETECTED

## 2018-11-04 MED ORDER — IBUPROFEN 800 MG PO TABS: 800.0000 mg | ORAL_TABLET | Freq: Three times a day (TID) | ORAL | 0 refills | Status: AC | PRN

## 2018-11-04 MED ORDER — IBUPROFEN 200 MG PO TABS
600.0000 mg | ORAL_TABLET | Freq: Once | ORAL | Status: AC
  Administered 2018-11-04: 600 mg via ORAL
  Filled 2018-11-04: qty 3
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
## 2018-11-04 NOTE — Discharge Instructions (Signed)
Please read and follow all provided instructions.  Your diagnoses today include:  1. Viral pharyngitis   2. Elevated blood pressure reading without diagnosis of hypertension     Tests performed today include: Strep test: was negative for strep throat Strep culture: you will be notified if this comes back positive Vital signs. See below for your results today.   Medications prescribed:  You are prescribed ibuprofen, a non-steroidal anti-inflammatory agent (NSAID) for pain. You may take 600 mg every 6 hours as needed for pain. If still requiring this medication around the clock for acute pain after 10 days, please see your primary healthcare provider.  Women who are pregnant, breastfeeding, or planning on becoming pregnant should not take non-steroidal anti-inflammatories such as Advil and Aleve. Tylenol is a safe over the counter pain reliever in pregnant women.  You may combine this medication with Tylenol, 650 mg every 6 hours, so you are receiving something for pain every 3 hours.  This is not a long-term medication unless under the care and direction of your primary provider. Taking this medication long-term and not under the supervision of a healthcare provider could increase the risk of stomach ulcers, kidney problems, and cardiovascular problems such as high blood pressure.   I also recommend Salon PAS patches at night.  These can be found over-the-counter.  You can also get over-the-counter lidocaine patches.  I recommend these at night because they are not medications that lower the seizure threshold.  Home care instructions:  Please read the educational materials provided and follow any instructions contained in this packet.  Follow-up instructions: Please follow-up with your primary care provider as needed for further evaluation of your symptoms.  Return instructions:  Please return to the Emergency Department if you experience worsening symptoms.  Return if you have  worsening problems swallowing, your neck becomes swollen, you cannot swallow your saliva or your voice becomes muffled.  Return with high persistent fever, persistent vomiting, or if you have trouble breathing.  Please return if you have any other emergent concerns. Please also return the emergency department for any worsening neck or back pain, numbness or weakness in any of your extremities, numbness in your groin, loss of bowel or bladder control.  Additional Information:  Your vital signs today were: BP (!) 136/95 (BP Location: Right Arm)    Pulse (!) 50    Temp 98.6 F (37 C) (Oral)    Resp 18    Ht 6' (1.829 m)    Wt 89.4 kg    SpO2 100%    BMI 26.72 kg/m  If your blood pressure (BP) was elevated above 135/85 this visit, please have this repeated by your doctor within one month. --------------
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
## 2020-01-13 ENCOUNTER — Encounter (HOSPITAL_COMMUNITY): Payer: Self-pay | Admitting: Emergency Medicine

## 2020-01-13 ENCOUNTER — Emergency Department (HOSPITAL_COMMUNITY)
Admission: EM | Admit: 2020-01-13 | Discharge: 2020-01-13 | Disposition: A | Discharge status: Home / Self Care | Payer: Self-pay | Attending: Emergency Medicine | Admitting: Emergency Medicine

## 2020-01-13 ENCOUNTER — Emergency Department (HOSPITAL_COMMUNITY): Payer: Self-pay

## 2020-01-13 DIAGNOSIS — W19XXXA Unspecified fall, initial encounter: Secondary | ICD-10-CM

## 2020-01-13 DIAGNOSIS — W108XXA Fall (on) (from) other stairs and steps, initial encounter: Secondary | ICD-10-CM | POA: Insufficient documentation

## 2020-01-13 DIAGNOSIS — S93402A Sprain of unspecified ligament of left ankle, initial encounter: Secondary | ICD-10-CM | POA: Insufficient documentation

## 2020-01-13 DIAGNOSIS — Y999 Unspecified external cause status: Secondary | ICD-10-CM | POA: Insufficient documentation

## 2020-01-13 DIAGNOSIS — Z79899 Other long term (current) drug therapy: Secondary | ICD-10-CM | POA: Insufficient documentation

## 2020-01-13 DIAGNOSIS — Y9289 Other specified places as the place of occurrence of the external cause: Secondary | ICD-10-CM | POA: Insufficient documentation

## 2020-01-13 DIAGNOSIS — S39012A Strain of muscle, fascia and tendon of lower back, initial encounter: Secondary | ICD-10-CM | POA: Insufficient documentation

## 2020-01-13 DIAGNOSIS — Y9389 Activity, other specified: Secondary | ICD-10-CM | POA: Insufficient documentation

## 2020-01-13 MED ORDER — IBUPROFEN 800 MG PO TABS: 800.0000 mg | ORAL_TABLET | Freq: Three times a day (TID) | ORAL | 0 refills | Status: DC

## 2020-01-13 MED ORDER — METHOCARBAMOL 500 MG PO TABS: 500.0000 mg | ORAL_TABLET | Freq: Four times a day (QID) | ORAL | 0 refills | Status: AC | PRN

## 2020-01-13 MED ORDER — IBUPROFEN 800 MG PO TABS
800.0000 mg | ORAL_TABLET | Freq: Once | ORAL | Status: AC
  Administered 2020-01-13: 800 mg via ORAL
  Filled 2020-01-13: qty 1

## 2020-01-13 MED ORDER — IBUPROFEN 800 MG PO TABS: 800.0000 mg | ORAL_TABLET | Freq: Once | ORAL | Status: DC

## 2020-01-13 MED ORDER — METHOCARBAMOL 500 MG PO TABS: 500.0000 mg | ORAL_TABLET | Freq: Four times a day (QID) | ORAL | 0 refills | Status: DC | PRN
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
## 2020-01-13 NOTE — Discharge Instructions (Signed)
1.  Wear your Ace wrap in your walking boot at all times when you are walking or standing.  Use your crutches to keep your weight off of the left foot.  Elevate your foot and leg as much as possible.  Remove the boot to place well wrapped ice packs around your ankle and foot. 2.  Schedule a follow-up appointment with Dr. Charlann Boxer of [facility].  You need a recheck this upcoming week.  You may need additional x-rays and possibly a cast. 3.  You have been recommended to take ibuprofen 800 mg for musculoskeletal pain associated with injuries.  You have been given a prescription.  4.  Return to the emergency department if you have new or worsening symptoms.

## 2020-01-13 NOTE — ED Notes (Signed)
Ortho Called  

## 2020-01-13 NOTE — ED Notes (Signed)
MD made aware patient is requesting additional pain medication.
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
## 2020-01-13 NOTE — ED Notes (Addendum)
Pt A/O and ambulatory with boot and crunches.
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
## 2020-01-13 NOTE — ED Notes (Signed)
ED Provider at bedside. 

## 2020-06-12 ENCOUNTER — Emergency Department (HOSPITAL_COMMUNITY)
Admission: EM | Admit: 2020-06-12 | Discharge: 2020-06-12 | Disposition: A | Discharge status: Home / Self Care | Payer: Self-pay | Attending: Emergency Medicine | Admitting: Emergency Medicine

## 2020-06-12 ENCOUNTER — Emergency Department (HOSPITAL_COMMUNITY): Payer: Self-pay

## 2020-06-12 DIAGNOSIS — Y9301 Activity, walking, marching and hiking: Secondary | ICD-10-CM | POA: Insufficient documentation

## 2020-06-12 DIAGNOSIS — Y998 Other external cause status: Secondary | ICD-10-CM | POA: Insufficient documentation

## 2020-06-12 DIAGNOSIS — R569 Unspecified convulsions: Secondary | ICD-10-CM | POA: Insufficient documentation

## 2020-06-12 DIAGNOSIS — Y9289 Other specified places as the place of occurrence of the external cause: Secondary | ICD-10-CM | POA: Insufficient documentation

## 2020-06-12 DIAGNOSIS — Z79899 Other long term (current) drug therapy: Secondary | ICD-10-CM | POA: Insufficient documentation

## 2020-06-12 DIAGNOSIS — X58XXXA Exposure to other specified factors, initial encounter: Secondary | ICD-10-CM | POA: Insufficient documentation

## 2020-06-12 DIAGNOSIS — F1210 Cannabis abuse, uncomplicated: Secondary | ICD-10-CM | POA: Insufficient documentation

## 2020-06-12 DIAGNOSIS — S01512A Laceration without foreign body of oral cavity, initial encounter: Secondary | ICD-10-CM | POA: Insufficient documentation

## 2020-06-12 LAB — URINALYSIS, ROUTINE W REFLEX MICROSCOPIC
Bilirubin Urine: NEGATIVE
Glucose, UA: NEGATIVE mg/dL
Ketones, ur: 5 mg/dL — AB
Leukocytes,Ua: NEGATIVE
Nitrite: NEGATIVE
Protein, ur: 100 mg/dL — AB
Specific Gravity, Urine: 1.015 (ref 1.005–1.030)
pH: 5 (ref 5.0–8.0)

## 2020-06-12 LAB — CBC
HCT: 45.6 % (ref 39.0–52.0)
Hemoglobin: 15.5 g/dL (ref 13.0–17.0)
MCH: 31.7 pg (ref 26.0–34.0)
MCHC: 34 g/dL (ref 30.0–36.0)
MCV: 93.3 fL (ref 80.0–100.0)
Platelets: 370 10*3/uL (ref 150–400)
RBC: 4.89 MIL/uL (ref 4.22–5.81)
RDW: 12.7 % (ref 11.5–15.5)
WBC: 15.4 10*3/uL — ABNORMAL HIGH (ref 4.0–10.5)
nRBC: 0 % (ref 0.0–0.2)

## 2020-06-12 LAB — BASIC METABOLIC PANEL
Anion gap: 11 (ref 5–15)
BUN: 13 mg/dL (ref 6–20)
CO2: 24 mmol/L (ref 22–32)
Calcium: 9.8 mg/dL (ref 8.9–10.3)
Chloride: 101 mmol/L (ref 98–111)
Creatinine, Ser: 1.4 mg/dL — ABNORMAL HIGH (ref 0.61–1.24)
GFR calc Af Amer: >60 mL/min (ref >60)
GFR calc non Af Amer: >60 mL/min (ref >60)
Glucose, Bld: 85 mg/dL (ref 70–99)
Potassium: 3.9 mmol/L (ref 3.5–5.1)
Sodium: 136 mmol/L (ref 135–145)

## 2020-06-12 LAB — RAPID URINE DRUG SCREEN, HOSP PERFORMED
Amphetamines: NOT DETECTED
Barbiturates: NOT DETECTED
Benzodiazepines: POSITIVE — AB
Cocaine: NOT DETECTED
Opiates: NOT DETECTED
Tetrahydrocannabinol: POSITIVE — AB

## 2020-06-12 LAB — CBG MONITORING, ED: Glucose-Capillary: 115 mg/dL — ABNORMAL HIGH (ref 70–99)

## 2020-06-12 LAB — TROPONIN I (HIGH SENSITIVITY)
Troponin I (High Sensitivity): 10 ng/L (ref <18)
Troponin I (High Sensitivity): 15 ng/L (ref <18)

## 2020-06-12 MED ORDER — LEVETIRACETAM 500 MG PO TABS: 500.0000 mg | ORAL_TABLET | Freq: Two times a day (BID) | ORAL | 0 refills | Status: DC

## 2020-06-12 MED ORDER — ALPRAZOLAM 0.5 MG PO TABS: 0.5000 mg | ORAL_TABLET | Freq: Every evening | ORAL | 0 refills | Status: AC | PRN

## 2020-06-12 MED ORDER — LORAZEPAM 2 MG/ML IJ SOLN
1.0000 mg | Freq: Once | INTRAMUSCULAR | Status: AC
  Administered 2020-06-12: 1 mg via INTRAMUSCULAR
  Filled 2020-06-12: qty 1

## 2020-06-12 NOTE — ED Triage Notes (Addendum)
Pt states he was outside walking and then woke up on the ground does have hematoma tot op of head. Pt reports hx of seizures but hasn't had one in over 1  Year also currently not taking seizure meds.

## 2020-06-12 NOTE — Discharge Instructions (Addendum)
You should wean yourself off xanax. I have sent 5 pills to the pharmacy for you. This is to safely wean you off xanax and prevent a seizure. This is a one time prescription, no refills.  Someone from [facility] should reach out to you via phone to help with substance abuse.  Per [facility] statutes, patients with seizures are not allowed to drive until  they have been seizure-free for six months. Use caution when using heavy equipment or power tools. Avoid working on ladders or at heights. Take showers instead of baths. Ensure the water temperature is not too high on the home water heater. Do not go swimming alone. When caring for infants or small children, sit down when holding, feeding, or changing them to minimize risk of injury to the child in the event you have a seizure.   Also, Maintain good sleep hygiene. Avoid alcohol.  Follow-up with cardiology and neurology.  Referrals have been sent.  The offices should be reaching out to you.  If you do not hear from them in 1 week you can call the office  Prescription printed for Keppra.  This is a seizure medication.  You should take this as prescribed.
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
## 2020-06-25 ENCOUNTER — Telehealth: Payer: Self-pay

## 2020-06-25 ENCOUNTER — Ambulatory Visit: Payer: Self-pay | Admitting: Interventional Cardiology

## 2020-06-25 NOTE — Telephone Encounter (Signed)
Called patient to make sure he could find office because he was late for appointment with Dr Katrinka Blazing and he stated he couldn't come in because of his daughter but he was having severe pain in chest area. I asked Lilyan Gilford. RN what to do and she told me to advise pt to go to Emergency room so I explained to patient he needed to go to ER and be evaluated. He agreed and said he could go later today but not right now because of his daughter.I explained its important to go ASAP. He stated his understanding. I told him after he got through being evaluated at emergency room to call us and reschedule appointment with Dr Katrinka Blazing. He was agreeable and stated he would.

## 2020-07-01 ENCOUNTER — Encounter: Payer: Self-pay | Admitting: General Practice

## 2021-06-16 ENCOUNTER — Emergency Department (HOSPITAL_COMMUNITY)
Admission: EM | Admit: 2021-06-16 | Discharge: 2021-06-16 | Attending: Emergency Medicine | Admitting: Emergency Medicine

## 2021-06-16 ENCOUNTER — Emergency Department (HOSPITAL_COMMUNITY)

## 2021-06-16 ENCOUNTER — Other Ambulatory Visit: Payer: Self-pay

## 2021-06-16 ENCOUNTER — Encounter (HOSPITAL_COMMUNITY): Payer: Self-pay

## 2021-06-16 DIAGNOSIS — S199XXA Unspecified injury of neck, initial encounter: Secondary | ICD-10-CM | POA: Diagnosis not present

## 2021-06-16 DIAGNOSIS — R040 Epistaxis: Secondary | ICD-10-CM | POA: Insufficient documentation

## 2021-06-16 DIAGNOSIS — M542 Cervicalgia: Secondary | ICD-10-CM

## 2021-06-16 DIAGNOSIS — X58XXXA Exposure to other specified factors, initial encounter: Secondary | ICD-10-CM | POA: Insufficient documentation

## 2021-06-16 DIAGNOSIS — S01511A Laceration without foreign body of lip, initial encounter: Secondary | ICD-10-CM | POA: Diagnosis not present

## 2021-06-16 DIAGNOSIS — S8011XA Contusion of right lower leg, initial encounter: Secondary | ICD-10-CM | POA: Insufficient documentation

## 2021-06-16 DIAGNOSIS — S0231XA Fracture of orbital floor, right side, initial encounter for closed fracture: Secondary | ICD-10-CM

## 2021-06-16 DIAGNOSIS — R000 Tachycardia, unspecified: Secondary | ICD-10-CM | POA: Insufficient documentation

## 2021-06-16 DIAGNOSIS — S0993XA Unspecified injury of face, initial encounter: Secondary | ICD-10-CM | POA: Diagnosis present

## 2021-06-16 DIAGNOSIS — S022XXA Fracture of nasal bones, initial encounter for closed fracture: Secondary | ICD-10-CM

## 2021-06-16 DIAGNOSIS — R569 Unspecified convulsions: Secondary | ICD-10-CM | POA: Diagnosis not present

## 2021-06-16 LAB — CBC WITH DIFFERENTIAL/PLATELET
Abs Immature Granulocytes: 0.04 10*3/uL (ref 0.00–0.07)
Basophils Absolute: 0.1 10*3/uL (ref 0.0–0.1)
Basophils Relative: 0 %
Eosinophils Absolute: 0 10*3/uL (ref 0.0–0.5)
Eosinophils Relative: 0 %
HCT: 41.8 % (ref 39.0–52.0)
Hemoglobin: 14.5 g/dL (ref 13.0–17.0)
Immature Granulocytes: 0 %
Lymphocytes Relative: 7 %
Lymphs Abs: 1 10*3/uL (ref 0.7–4.0)
MCH: 32.7 pg (ref 26.0–34.0)
MCHC: 34.7 g/dL (ref 30.0–36.0)
MCV: 94.4 fL (ref 80.0–100.0)
Monocytes Absolute: 1 10*3/uL (ref 0.1–1.0)
Monocytes Relative: 6 %
Neutro Abs: 14 10*3/uL — ABNORMAL HIGH (ref 1.7–7.7)
Neutrophils Relative %: 87 %
Platelets: 319 10*3/uL (ref 150–400)
RBC: 4.43 MIL/uL (ref 4.22–5.81)
RDW: 12.8 % (ref 11.5–15.5)
WBC: 16.1 10*3/uL — ABNORMAL HIGH (ref 4.0–10.5)
nRBC: 0 % (ref 0.0–0.2)

## 2021-06-16 LAB — COMPREHENSIVE METABOLIC PANEL
ALT: 46 U/L — ABNORMAL HIGH (ref 0–44)
AST: 45 U/L — ABNORMAL HIGH (ref 15–41)
Albumin: 4.6 g/dL (ref 3.5–5.0)
Alkaline Phosphatase: 73 U/L (ref 38–126)
Anion gap: 6 (ref 5–15)
BUN: 10 mg/dL (ref 6–20)
CO2: 22 mmol/L (ref 22–32)
Calcium: 9.2 mg/dL (ref 8.9–10.3)
Chloride: 108 mmol/L (ref 98–111)
Creatinine, Ser: 1.14 mg/dL (ref 0.61–1.24)
GFR, Estimated: >60 mL/min (ref >60)
Glucose, Bld: 110 mg/dL — ABNORMAL HIGH (ref 70–99)
Potassium: 3.4 mmol/L — ABNORMAL LOW (ref 3.5–5.1)
Sodium: 136 mmol/L (ref 135–145)
Total Bilirubin: 1 mg/dL (ref 0.3–1.2)
Total Protein: 8 g/dL (ref 6.5–8.1)

## 2021-06-16 LAB — MAGNESIUM: Magnesium: 3 mg/dL — ABNORMAL HIGH (ref 1.7–2.4)

## 2021-06-16 LAB — LACTIC ACID, PLASMA: Lactic Acid, Venous: 2.2 mmol/L (ref 0.5–1.9)

## 2021-06-16 LAB — CBG MONITORING, ED: Glucose-Capillary: 136 mg/dL — ABNORMAL HIGH (ref 70–99)

## 2021-06-16 MED ORDER — LEVETIRACETAM 500 MG PO TABS: 500.0000 mg | ORAL_TABLET | Freq: Two times a day (BID) | ORAL | 0 refills | Status: AC

## 2021-06-16 MED ORDER — ACETAMINOPHEN 325 MG PO TABS
650.0000 mg | ORAL_TABLET | Freq: Once | ORAL | Status: AC
  Administered 2021-06-16: 650 mg via ORAL
  Filled 2021-06-16: qty 2

## 2021-06-16 MED ORDER — LEVETIRACETAM IN NACL 1000 MG/100ML IV SOLN
1000.0000 mg | Freq: Once | INTRAVENOUS | Status: AC
  Administered 2021-06-16: 1000 mg via INTRAVENOUS
  Filled 2021-06-16: qty 100

## 2021-06-16 MED ORDER — LEVETIRACETAM IN NACL 500 MG/100ML IV SOLN: 500.0000 mg | Freq: Once | INTRAVENOUS | Status: DC

## 2021-06-16 MED ORDER — ACETAMINOPHEN 500 MG PO TABS: 1000.0000 mg | ORAL_TABLET | Freq: Three times a day (TID) | ORAL | 0 refills | Status: AC | PRN

## 2021-06-16 MED ORDER — IBUPROFEN 800 MG PO TABS: 800.0000 mg | ORAL_TABLET | Freq: Three times a day (TID) | ORAL | 0 refills | Status: AC
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
## 2021-06-16 NOTE — ED Notes (Signed)
Contacted [facility] to give discharge report, this RN was informed that no nurse is on duty tonight and to give all paperwork to officer escorting patient.

## 2021-06-16 NOTE — ED Triage Notes (Signed)
Pt presents to ED from [facility] for seizure at 1550. Pt states he went to jail and has not had his medication since being in jail 06/14/21. Pt states he takes Keppra

## 2021-06-16 NOTE — Discharge Instructions (Addendum)
Brandon Riddle has a right orbital floor fracture as well as a right nasal bone fracture.  Patient will need to follow-up with ear nose and throat listed above.  Patient needs to be restarted on his Keppra medication.  Patient may receive an 800mg  ibuprofen every 8 hours as needed for pain.  Patient may also receive 1000 mg Tylenol every 8 hours as needed for pain.
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
## 2021-06-16 NOTE — ED Notes (Signed)
Date and time results received: 06/16/21 1937  Test: lactic Critical Value: 2.2  Name of Provider Notified: Theron Arista, [place]  Orders Received? Or Actions Taken?: acknowledged
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
